# Patient Record
Sex: Male | Born: 1970 | Race: White | Hispanic: No | Marital: Married | Smoking: Never smoker
Health system: Southern US, Community
[De-identification: ages and names within clinical notes are randomized; demographics above are authoritative.]

## PROBLEM LIST (undated history)

## (undated) HISTORY — PX: ELBOW SURGERY: SHX618

## (undated) HISTORY — PX: HERNIA REPAIR: SHX51

## (undated) HISTORY — PX: SHOULDER SURGERY: SHX246

---

## 2009-09-09 ENCOUNTER — Emergency Department (HOSPITAL_BASED_OUTPATIENT_CLINIC_OR_DEPARTMENT_OTHER): Admission: EM | Admit: 2009-09-09 | Discharge: 2009-09-09 | Payer: Self-pay | Admitting: Emergency Medicine

## 2014-06-01 ENCOUNTER — Emergency Department
Admission: EM | Admit: 2014-06-01 | Discharge: 2014-06-01 | Disposition: A | Discharge status: Home / Self Care | Payer: BC Managed Care – PPO | Source: Home / Self Care

## 2014-06-01 ENCOUNTER — Emergency Department (INDEPENDENT_AMBULATORY_CARE_PROVIDER_SITE_OTHER): Payer: BC Managed Care – PPO

## 2014-06-01 ENCOUNTER — Encounter: Payer: Self-pay | Admitting: Emergency Medicine

## 2014-06-01 DIAGNOSIS — M7989 Other specified soft tissue disorders: Secondary | ICD-10-CM

## 2014-06-01 DIAGNOSIS — L02519 Cutaneous abscess of unspecified hand: Secondary | ICD-10-CM

## 2014-06-01 DIAGNOSIS — L03011 Cellulitis of right finger: Secondary | ICD-10-CM

## 2014-06-01 DIAGNOSIS — M79644 Pain in right finger(s): Secondary | ICD-10-CM

## 2014-06-01 DIAGNOSIS — R5383 Other fatigue: Secondary | ICD-10-CM

## 2014-06-01 DIAGNOSIS — L03019 Cellulitis of unspecified finger: Secondary | ICD-10-CM

## 2014-06-01 DIAGNOSIS — M79609 Pain in unspecified limb: Secondary | ICD-10-CM

## 2014-06-01 DIAGNOSIS — R5381 Other malaise: Secondary | ICD-10-CM

## 2014-06-01 LAB — POCT CBC W AUTO DIFF (K'VILLE URGENT CARE)

## 2014-06-01 LAB — TSH: TSH: 4.392 u[IU]/mL (ref 0.350–4.500)

## 2014-06-01 LAB — URIC ACID: Uric Acid, Serum: 6.9 mg/dL (ref 4.0–7.8)

## 2014-06-01 MED ORDER — CEPHALEXIN 500 MG PO CAPS: 500.0000 mg | ORAL_CAPSULE | Freq: Two times a day (BID) | ORAL | Status: AC

## 2014-06-01 MED ORDER — PREDNISONE 20 MG PO TABS: ORAL_TABLET | ORAL | Status: DC

## 2014-06-01 NOTE — ED Provider Notes (Signed)
CSN: [id number]     Arrival date & time 06/01/14  0815 History   None    Chief Complaint  Patient presents with  . 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 He noticed some pain in his right thumb approximately 5 days ago. Swelling started a few days later. Warm to touch. Very painful. No known injury. He has taken 3 doses of keflex. Redness has improved but swelling and pain remains the same. Rates pain 4/10 constant and sharp. Worse with any movement or touch. No fever, chills, nausea or vomiting. No personal hx of gout but father does have gout. No NSAIDs taken.   Pt does complain of fatigue and request thyroid checked.   History reviewed. No pertinent past medical history. Past Surgical History  Procedure Laterality Date  . Hernia repair    . Elbow surgery    . Shoulder surgery     History reviewed. No pertinent family history. History  Substance Use Topics  . Smoking status: Never Smoker   . Smokeless tobacco: Not on file  . Alcohol Use: No    Review of Systems  All other systems reviewed and are negative.   Allergies  Review of patient's allergies indicates no known allergies.  Home Medications   Prior to Admission medications   Medication Sig Start Date End Date Taking? Authorizing Provider  cephALEXin (KEFLEX) 500 MG capsule Take 1 capsule (500 mg total) by mouth 2 (two) times daily. 06/01/14   Mahlia Fernando L Keyondre Hepburn, PA-C  predniSONE (DELTASONE) 20 MG tablet Take 3 tablets for 3 days, take 2 tablets for 3 days, take 1 tablet for 3 days, take 1/2 tablet for 3 days. 06/01/14   Conor Filsaime L Griffin Dewilde, PA-C   BP 134/87  Pulse 99  Temp(Src) 97.6 F (36.4 C) (Oral)  Resp 18  Ht  (1.778 m)  Wt 230 lb (104.327 kg)  BMI 33.00 kg/m2  SpO2 97% Physical Exam  Musculoskeletal:  Significant swelling over thenar eminence on right hand. Pain to palpation over base of right  thumb.  Limited ROM due to pain.  Pain with resistance of thumb.   Psychiatric: He has a normal mood and affect.    ED Course  Procedures (including critical care time) Labs Review Labs Reviewed  URIC ACID  TSH  POCT CBC W AUTO DIFF ([facility])    Imaging Review Dg Finger Thumb Right  06/01/2014   CLINICAL DATA:  Pain and swelling  EXAM: RIGHT THUMB 2+V  COMPARISON:  None.  FINDINGS: Frontal, oblique, and lateral views were obtained. There is no fracture or dislocation. Joint spaces appear intact. No erosive change. No radiopaque foreign body.  IMPRESSION: No abnormality noted.   Electronically Signed   By: Bretta Bang M.D.   On: 06/01/2014 09:06     MDM   1. Cellulitis of finger of right hand   2. Swelling of thumb, right   3. Pain of right thumb   4. Other malaise and fatigue    CBC- WBC 10.4, LY 25.2%,  PLT 268, HgB 15.7 White count upper limit normal. Will check uric acid. Will check TSH per pt's request and with dx of fatigue.  Xray normal.  Gout vs sprain vs cellulitis Ice thumb regularly. up to 3 times a day.  Ibuprofen  up to three times a day.  ACE wrap to compress.  Keflex for 7 days.  Prednisone taper.  Follow up with Dr. Karie Schwalbe in 3 days or if symptoms not improving.     Jomarie Longs, PA-C 06/01/14 [phone]

## 2014-06-01 NOTE — Discharge Instructions (Signed)
Ice thumb regularly. up to 3 times a day.  Ibuprofen  up to three times a day.  ACE wrap to compress.  Keflex for 7 days.  Prednisone taper.

## 2014-06-01 NOTE — ED Notes (Signed)
Pt c/o RT hand pain and swelling x 5 days. Denies injury. He reports taking Keflex last night and today.

## 2014-06-07 ENCOUNTER — Telehealth: Payer: Self-pay | Admitting: *Deleted

## 2014-06-08 NOTE — ED Provider Notes (Signed)
Agree with exam, assessment, and plan.   Lattie Haw, MD 06/08/14 [phone]

## 2020-05-22 ENCOUNTER — Emergency Department (HOSPITAL_BASED_OUTPATIENT_CLINIC_OR_DEPARTMENT_OTHER): Payer: 59

## 2020-05-22 ENCOUNTER — Other Ambulatory Visit: Payer: Self-pay

## 2020-05-22 ENCOUNTER — Encounter (HOSPITAL_BASED_OUTPATIENT_CLINIC_OR_DEPARTMENT_OTHER): Payer: Self-pay | Admitting: Emergency Medicine

## 2020-05-22 ENCOUNTER — Emergency Department (HOSPITAL_BASED_OUTPATIENT_CLINIC_OR_DEPARTMENT_OTHER)
Admission: EM | Admit: 2020-05-22 | Discharge: 2020-05-22 | Disposition: A | Discharge status: Home / Self Care | Payer: 59 | Attending: Emergency Medicine | Admitting: Emergency Medicine

## 2020-05-22 DIAGNOSIS — U071 COVID-19: Secondary | ICD-10-CM | POA: Insufficient documentation

## 2020-05-22 DIAGNOSIS — R599 Enlarged lymph nodes, unspecified: Secondary | ICD-10-CM | POA: Diagnosis not present

## 2020-05-22 DIAGNOSIS — R5381 Other malaise: Secondary | ICD-10-CM | POA: Diagnosis present

## 2020-05-22 DIAGNOSIS — J208 Acute bronchitis due to other specified organisms: Secondary | ICD-10-CM

## 2020-05-22 LAB — TROPONIN I (HIGH SENSITIVITY): Troponin I (High Sensitivity): 4 ng/L (ref <18)

## 2020-05-22 LAB — CBC
HCT: 43.5 % (ref 39.0–52.0)
Hemoglobin: 14.3 g/dL (ref 13.0–17.0)
MCH: 28.3 pg (ref 26.0–34.0)
MCHC: 32.9 g/dL (ref 30.0–36.0)
MCV: 86.1 fL (ref 80.0–100.0)
Platelets: 264 10*3/uL (ref 150–400)
RBC: 5.05 MIL/uL (ref 4.22–5.81)
RDW: 14.9 % (ref 11.5–15.5)
WBC: 11.8 10*3/uL — ABNORMAL HIGH (ref 4.0–10.5)
nRBC: 0 % (ref 0.0–0.2)

## 2020-05-22 LAB — BASIC METABOLIC PANEL
Anion gap: 12 (ref 5–15)
BUN: 15 mg/dL (ref 6–20)
CO2: 25 mmol/L (ref 22–32)
Calcium: 8.7 mg/dL — ABNORMAL LOW (ref 8.9–10.3)
Chloride: 99 mmol/L (ref 98–111)
Creatinine, Ser: 0.93 mg/dL (ref 0.61–1.24)
GFR calc Af Amer: >60 mL/min (ref >60)
GFR calc non Af Amer: >60 mL/min (ref >60)
Glucose, Bld: 110 mg/dL — ABNORMAL HIGH (ref 70–99)
Potassium: 4 mmol/L (ref 3.5–5.1)
Sodium: 136 mmol/L (ref 135–145)

## 2020-05-22 LAB — HEPATIC FUNCTION PANEL
ALT: 32 U/L (ref 0–44)
AST: 31 U/L (ref 15–41)
Albumin: 3.5 g/dL (ref 3.5–5.0)
Alkaline Phosphatase: 48 U/L (ref 38–126)
Bilirubin, Direct: 0.2 mg/dL (ref 0.0–0.2)
Indirect Bilirubin: 0.5 mg/dL (ref 0.3–0.9)
Total Bilirubin: 0.7 mg/dL (ref 0.3–1.2)
Total Protein: 7.2 g/dL (ref 6.5–8.1)

## 2020-05-22 MED ORDER — PREDNISONE 10 MG (21) PO TBPK: ORAL_TABLET | Freq: Every day | ORAL | 0 refills | Status: AC

## 2020-05-22 MED ORDER — HYDROCODONE-HOMATROPINE 5-1.5 MG/5ML PO SYRP
5.0000 mL | ORAL_SOLUTION | Freq: Once | ORAL | Status: DC
  Filled 2020-05-22: qty 5

## 2020-05-22 MED ORDER — IOHEXOL 350 MG/ML SOLN
100.0000 mL | Freq: Once | INTRAVENOUS | Status: AC | PRN
  Administered 2020-05-22: 66 mL via INTRAVENOUS

## 2020-05-22 MED ORDER — ALBUTEROL SULFATE HFA 108 (90 BASE) MCG/ACT IN AERS: 8.0000 | INHALATION_SPRAY | Freq: Four times a day (QID) | RESPIRATORY_TRACT | 0 refills | Status: AC | PRN

## 2020-05-22 MED ORDER — AEROCHAMBER PLUS FLO-VU MEDIUM MISC
1.0000 | Freq: Once | Status: AC
  Administered 2020-05-22: 1
  Filled 2020-05-22: qty 1

## 2020-05-22 MED ORDER — HYDROCODONE-HOMATROPINE 5-1.5 MG/5ML PO SYRP: 5.0000 mL | ORAL_SOLUTION | Freq: Four times a day (QID) | ORAL | 0 refills | Status: DC | PRN

## 2020-05-22 MED ORDER — ALBUTEROL SULFATE HFA 108 (90 BASE) MCG/ACT IN AERS
8.0000 | INHALATION_SPRAY | Freq: Once | RESPIRATORY_TRACT | Status: AC
  Administered 2020-05-22: 8 via RESPIRATORY_TRACT
  Filled 2020-05-22: qty 6.7

## 2020-05-22 MED ORDER — HYDROCOD POLST-CPM POLST ER 10-8 MG/5ML PO SUER
5.0000 mL | Freq: Once | ORAL | Status: AC
  Administered 2020-05-22: 5 mL via ORAL
  Filled 2020-05-22: qty 5

## 2020-05-22 MED ORDER — HYDROCODONE-HOMATROPINE 5-1.5 MG/5ML PO SYRP: 5.0000 mL | ORAL_SOLUTION | Freq: Four times a day (QID) | ORAL | 0 refills | Status: AC | PRN

## 2020-05-22 MED ORDER — ALBUTEROL SULFATE (2.5 MG/3ML) 0.083% IN NEBU: 5.0000 mg | INHALATION_SOLUTION | Freq: Once | RESPIRATORY_TRACT | Status: DC

## 2020-05-22 MED ORDER — PREDNISONE 50 MG PO TABS
60.0000 mg | ORAL_TABLET | Freq: Once | ORAL | Status: AC
  Administered 2020-05-22: 60 mg via ORAL
  Filled 2020-05-22: qty 1

## 2020-05-22 NOTE — ED Triage Notes (Signed)
Pt states positive covid test 10 days, increasing coughing and SOB since test, last in past 2 days, back pain from "muscles pulled"

## 2020-05-22 NOTE — ED Notes (Signed)
Pt ambulated from lobby to room, maintained 94-96% O2 but reports difficulty breathing. No change in sats once settled on stretcher.

## 2020-05-22 NOTE — ED Notes (Signed)
Sent blue and dark green also to lab, extra tubes drawn

## 2020-05-22 NOTE — Discharge Instructions (Signed)
You were seen in the ED for cough with pain, shortness of breath  Symptoms likely from continued COVID illness  Other testing done today was normal. There was no blood clot on CT.   Take 8 puffs of albuterol with spacer every 4 hours.  Prednisone will help with airway inflammation.  Hycodan syrup every 6 hours to suppress cough and help with pain related to cough  Schedule follow up appointment with primary care doctor in 72 hours for re-evaluation to ensure symptoms are improving   Stay well-hydrated. Rest. You can use over the counter medications to help with symptoms: [phone] mg acetaminophen (tylenol) every 6 hours, around the clock to help with associated fevers, sore throat, headaches, generalized body aches and malaise.  Oxymetazoline (afrin) intranasal spray once daily for no more than 3 days to help with congestion, after 3 days you can switch to another over-the-counter nasal steroid spray such as fluticasone (flonase) Allergy medication (loratadine, cetirizine, etc) and phenylephrine (sudafed) help with nasal congestion, runny nose and postnasal drip.   Wash your hands often to prevent spread.  Stay hydrated with plenty of clear fluids Rest   Return to the ED if symptoms are worsening or severe, there is increased work of breathing, chest pain or shortness of breath with exertion or activity, inability to tolerate fluids due to persistent vomiting despite nausea medicines, passing out, light headedness.

## 2020-05-22 NOTE — ED Provider Notes (Signed)
[facility] EMERGENCY DEPARTMENT Provider Note   CSN: [id number] Arrival date & time: 05/22/20  1245     History Chief Complaint  Patient presents with  . Shortness of Breath    Benjamin Hester is a 49 y.o. male with pertinent history of Cov[facility] diagnosis [facility] ago [facility] presents to the ED for evaluation of worsening symptoms for the last 2 days. "I can't put up with it anymore".  Reports initially just having generalized malaise but over the last 2 days has had worsening dry forceful coughing fits, pain in his upper back with coughing, shortness of breath.  Overall feels poorly.  No energy. Today he fell down to the floor from the severe pain he had during a coughing fit and felt like he was almost going to pass out.  Denies frank loss of consciousness.  Denies chest pain but states the back of his lungs hurt when he coughs, moves.  He was seen by a general doctor a week ago and prescribed amoxicillin and inhalers.  States he cannot take a deep enough breath for the inhalers to work.  States amoxicillin helps with the mucus in his lungs but the cough persists.  Has taken Motrin as needed as well.  Denies any other symptoms including fever, congestion, sore throat, hemoptysis, vomiting, diarrhea, abdominal pain, leg swelling, loss of taste or smell.  Denies significant medical history.  No history of blood clots, recent surgery, prolonged immobilization, hormone therapy. No tobacco use.   HPI     History reviewed. No pertinent past medical history.  There are no problems to display for this patient.   Past Surgical History:  Procedure Laterality Date  . ELBOW SURGERY    . HERNIA REPAIR    . SHOULDER SURGERY         History reviewed. No pertinent family history.  Social History   Tobacco Use  . Smoking status: Never Smoker  Substance Use Topics  . Alcohol use: No  . Drug use: No    Home Medications Prior to Admission medications   Medication Sig  Start Date End Date Taking? Authorizing Provider  cephALEXin (KEFLEX) 500 MG capsule Take 1 capsule (500 mg total) by mouth 2 (two) times daily. 06/01/14   Breeback, Jade L, PA-C  predniSONE (DELTASONE) 20 MG tablet Take 3 tablets for 3 days, take 2 tablets for 3 days, take 1 tablet for 3 days, take 1/2 tablet for 3 days. 06/01/14   Jomarie Longs, PA-C    Allergies    Patient has no known allergies.  Review of Systems   Review of Systems  Respiratory: Positive for cough and shortness of breath.   Musculoskeletal: Positive for back pain and myalgias.  Neurological: Positive for weakness (generalized) and light-headedness.  All other systems reviewed and are negative.   Physical Exam Updated Vital Signs BP (!) 154/81 (BP Location: Right Arm)   Pulse (!) 104   Temp 98.1 F (36.7 C) (Oral)   Resp (!) 23   Ht 5\' 10"  (1.778 m)   Wt 104.3 kg   SpO2 94%   BMI 33.00 kg/m   Physical Exam Vitals and nursing note reviewed.  Constitutional:      General: He is not in acute distress.    Appearance: He is well-developed.     Comments: Appears tired but nontoxic  HENT:     Head: Normocephalic and atraumatic.     Right Ear: External ear normal.  Left Ear: External ear normal.     Nose: Nose normal.  Eyes:     General: No scleral icterus.    Conjunctiva/sclera: Conjunctivae normal.  Cardiovascular:     Rate and Rhythm: Normal rate and regular rhythm.     Heart sounds: Normal heart sounds. No murmur heard.      Comments: No lower extremity edema.  No calf tenderness.  No murmurs Pulmonary:     Effort: Pulmonary effort is normal.     Breath sounds: Decreased breath sounds present.     Comments: Lungs clear throughout however diminished sounds in the mid/lower lobes.  Reproducible posterior thoracic/lateral chest wall tenderness.  No crackles.  No rhonchi.  No wheezing. Musculoskeletal:        General: No deformity. Normal range of motion.     Cervical back: Normal range of  motion and neck supple.  Skin:    General: Skin is warm and dry.     Capillary Refill: Capillary refill takes less than 2 seconds.  Neurological:     Mental Status: He is alert and oriented to person, place, and time.  Psychiatric:        Behavior: Behavior normal.        Thought Content: Thought content normal.        Judgment: Judgment normal.     ED Results / Procedures / Treatments   Labs (all labs ordered are listed, but only abnormal results are displayed) Labs Reviewed  BASIC METABOLIC PANEL - Abnormal; Notable for the following components:      Result Value   Glucose, Bld 110 (*)    Calcium 8.7 (*)    All other components within normal limits  CBC - Abnormal; Notable for the following components:   WBC 11.8 (*)    All other components within normal limits  HEPATIC FUNCTION PANEL  TROPONIN I (HIGH SENSITIVITY)  TROPONIN I (HIGH SENSITIVITY)    EKG None  Radiology DG Chest 2 View  Result Date: 05/22/2020 CLINICAL DATA:  Shortness of breath.  COVID-19. EXAM: CHEST - 2 VIEW COMPARISON:  None. FINDINGS: The heart size and mediastinal contours are within normal limits. No pneumothorax or pleural effusion is noted. Bilateral patchy airspace opacities are noted consistent with multifocal pneumonia due to COVID-19. The visualized skeletal structures are unremarkable. IMPRESSION: Bilateral multifocal pneumonia. Electronically Signed   By: Lupita Raider M.D.   On: 05/22/2020 14:00   CT Angio Chest PE W and/or Wo Contrast  Result Date: 05/22/2020 CLINICAL DATA:  Worsening shortness of breath, cough, back pain. COVID diagnosis 10 days ago. EXAM: CT ANGIOGRAPHY CHEST WITH CONTRAST TECHNIQUE: Multidetector CT imaging of the chest was performed using the standard protocol during bolus administration of intravenous contrast. Multiplanar CT image reconstructions and MIPs were obtained to evaluate the vascular anatomy. CONTRAST:  55mL OMNIPAQUE IOHEXOL 350 MG/ML SOLN COMPARISON:  None.  FINDINGS: Cardiovascular: There is no pulmonary embolism identified within the main, lobar or segmental pulmonary arteries bilaterally. No thoracic aortic aneurysm or evidence of aortic dissection. Heart size is within normal limits. No pericardial effusion. Mediastinum/Nodes: Esophagus appears normal. No mass or enlarged lymph nodes are seen within the mediastinum or perihilar regions. Mildly prominent lymph nodes in the perihilar regions are likely reactive in nature. Trachea and central bronchi are unremarkable. Lungs/Pleura: Patchy bilateral ground-glass airspace opacities, lower lobe predominant, peripheral predominance, consistent with multifocal pneumonia. No pleural effusion or pneumothorax. Upper Abdomen: Limited images of the upper abdomen are unremarkable. Musculoskeletal: No acute  or suspicious osseous finding. Review of the MIP images confirms the above findings. IMPRESSION: 1. Patchy bilateral ground-glass airspace opacities, lower lobe predominant, consistent with multifocal pneumonia. Favor COVID-19 pneumonia. 2. No pulmonary embolism seen. 3. Mildly prominent lymph nodes in the perihilar regions, likely reactive in nature. Electronically Signed   By: Bary Richard M.D.   On: 05/22/2020 15:40    Procedures Procedures (including critical care time)  Medications Ordered in ED Medications  albuterol (VENTOLIN HFA) 108 (90 Base) MCG/ACT inhaler 8 puff (8 puffs Inhalation Given 05/22/20 1444)  AeroChamber Plus Flo-Vu Medium MISC 1 each (1 each Other Given 05/22/20 1448)  predniSONE (DELTASONE) tablet 60 mg (60 mg Oral Given 05/22/20 1439)  chlorpheniramine-HYDROcodone (TUSSIONEX) 10-8 MG/5ML suspension 5 mL (5 mLs Oral Given 05/22/20 1440)  iohexol (OMNIPAQUE) 350 MG/ML injection 100 mL (66 mLs Intravenous Contrast Given 05/22/20 1525)    ED Course  I have reviewed the triage vital signs and the nursing notes.  Pertinent labs & imaging results that were available during my care of the patient were  reviewed by me and considered in my medical decision making (see chart for details).  Clinical Course as of May 22 1624  Sun May 22, 2020  1410 Pulse Rate(!): 118 [CG]  1410 Resp(!): 24 [CG]  1410 Temp: 98.7 F (37.1 C) [CG]  1410 WBC(!): 11.8 [CG]  1411 The heart size and mediastinal contours are within normal limits. No pneumothorax or pleural effusion is noted. Bilateral patchy airspace opacities are noted consistent with multifocal pneumonia due to COVID-19. The visualized skeletal structures are unremarkable.  DG Chest 2 View [CG]  1545 IMPRESSION: 1. Patchy bilateral ground-glass airspace opacities, lower lobe predominant, consistent with multifocal pneumonia. Favor COVID-19 pneumonia. 2. No pulmonary embolism seen. 3. Mildly prominent lymph nodes in the perihilar regions, likely reactive in nature.  CT Angio Chest PE W and/or Wo Contrast [CG]    Clinical Course User Index [CG] Jerrell Mylar   MDM Rules/Calculators/A&P                          49 year old male presents for worsening Covid symptoms specifically worsening forceful coughing fits, shortness of breath, generalized malaise for the last 2 days.  Has been on amoxicillin and inhalers for 1 week.  Denies significant cardio or pulmonary medical problems, tobacco use.  No street blood clots.  Chief complain involves an extensive number of treatment options and is a complaint that carries with it a high risk of complications and morbidity and mortality.    Differential diagnosis includes worsening Covid illness, pulmonary embolism.  He denies frank chest pain, myocarditis, pericarditis less likely.  Laboratory studies and imaging ordered in triage RN and I ordered additional laboratory and imaging studies including CBC, BMP, chest x-ray and EKG.  Given symptoms and 10 days post Covid diagnosis, I have ordered a CT to rule out a PE.  I have personally visualized and interpreted labs and imaging.    ER work up  reveals minimal leukocytosis to BC 11.8.  Hyperglycemia, mild.  LFTs normal.  Troponin undetectable.  EKG with sinus tachycardia but no signs of right ventricular strain, ischemia.  Chest x-ray consistent with multifocal atypical pneumonia.  CTA is negative for PE and shows bilateral lower lobe infiltrates.  I ordered medications including Tussionex, albuterol inhaler/spacer, prednisone.  I have ordered continuous cardiac monitoring, pulse ox.  Will plan for serial re-examinations. Close monitoring.   1618: I have  re-evaluated the patient.   He reports mild improvement in symptoms.  Tachycardia has improved.  Mildly tachypneic RR 23 but SPO2 94%.  Ambulated here without hypoxia.  Discussed ER results reassuring.    Given benign work-up, improvement in clinical symptoms and normal SPO2, we will discharge with prednisone taper, Hycodan, albuterol inhaler for possible bronchitis/bronchospasm.  Encouraged PCP follow-up in 72 hours to reassess for clinical deterioration.  Return precautions discussed.  He is comfortable with this plan.  Final Clinical Impression(s) / ED Diagnoses Final diagnoses:  Acute bronchitis due to COVID-19 virus    Rx / DC Orders ED Discharge Orders    None       Liberty HandyGibbons, Birtie Fellman J, PA-C 05/22/20 1625    Alvira MondaySchlossman, Erin, MD 05/23/20 1038

## 2021-09-12 IMAGING — CT CT ANGIO CHEST
2 of 10 series · 17 of 36 positions shown · IV contrast (Omnipaque)
Comparison: None.

CLINICAL DATA: Worsening shortness of breath, cough, back pain.
COVID diagnosis 10 days ago.

EXAM:
CT ANGIOGRAPHY CHEST WITH CONTRAST
TECHNIQUE: Multidetector CT imaging of the chest was performed using the
standard protocol during bolus administration of intravenous
contrast. Multiplanar CT image reconstructions and MIPs were
obtained to evaluate the vascular anatomy.
CONTRAST:  66mL OMNIPAQUE IOHEXOL 350 MG/ML SOLN

[Series 6: pe thins · axial · 0.90mm/px · z∈[+733,+1013]mm · 16 of 423 slices shown]
[im 25/423  lung]
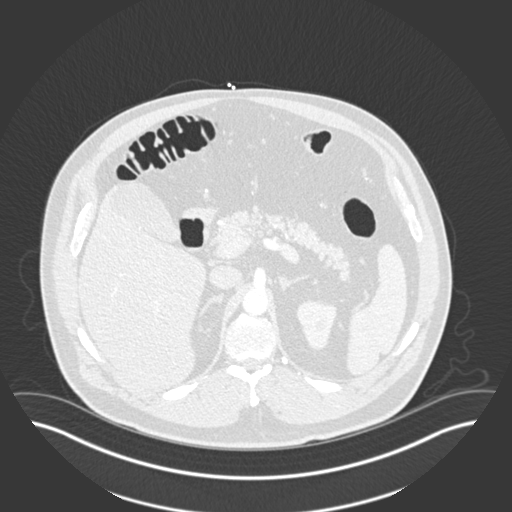
[im 50/423  mediastinal]
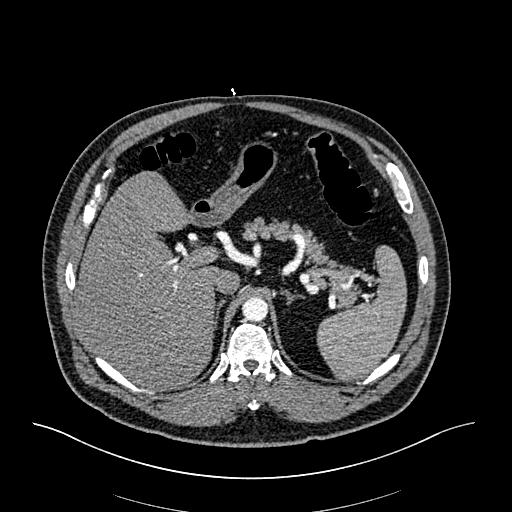
[im 75/423  lung]
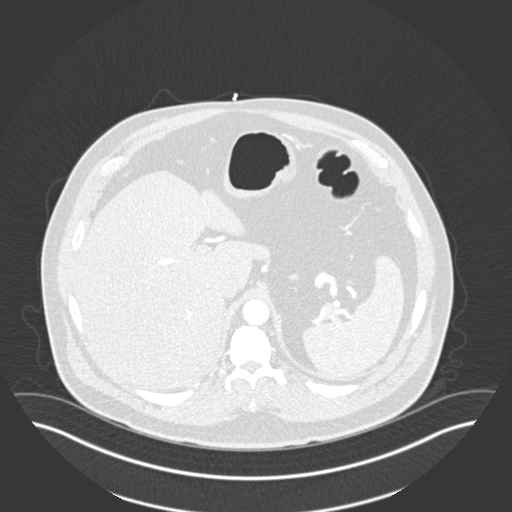
[im 100/423  mediastinal]
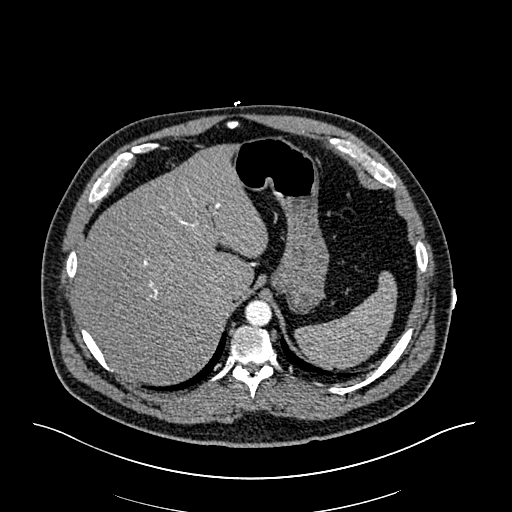
[im 125/423  lung]
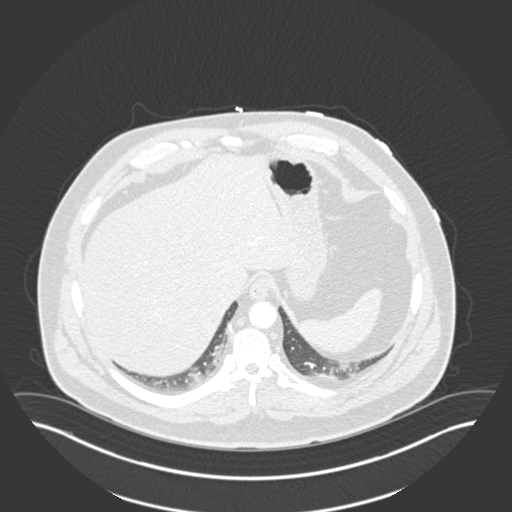
[im 149/423  mediastinal]
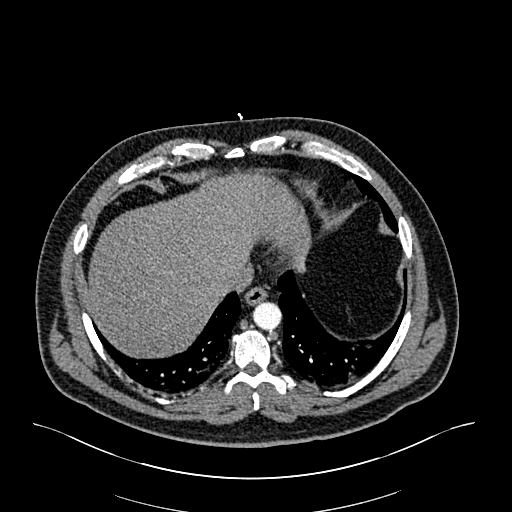
[im 174/423  lung]
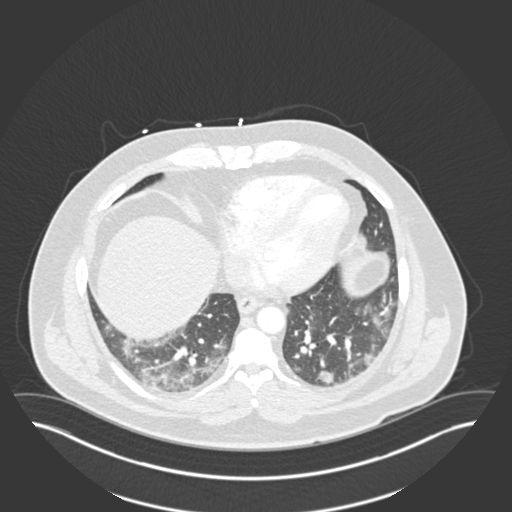
[im 199/423  mediastinal]
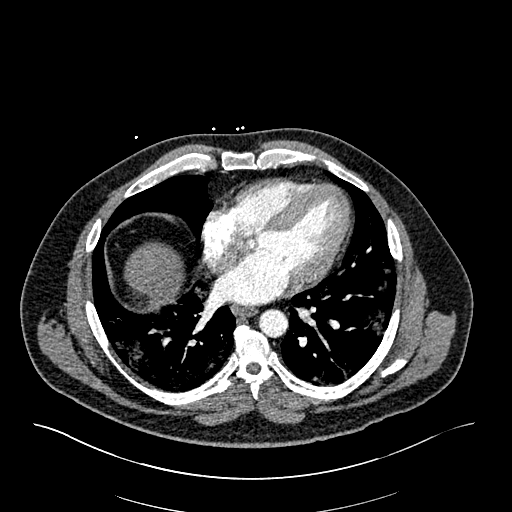
[im 224/423  lung]
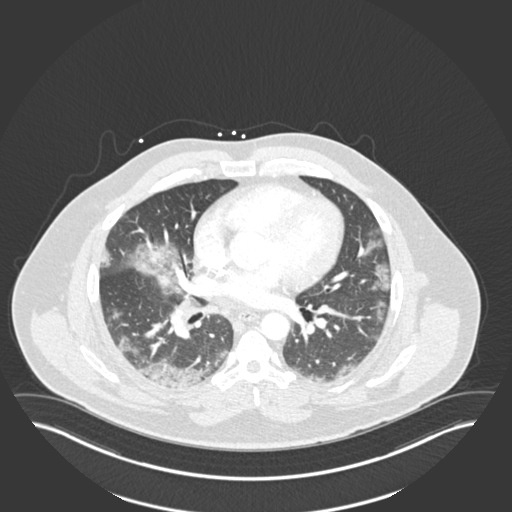
[im 249/423  mediastinal]
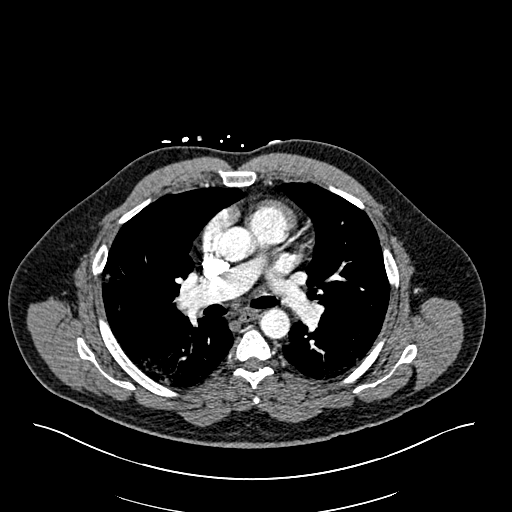
[im 274/423  lung]
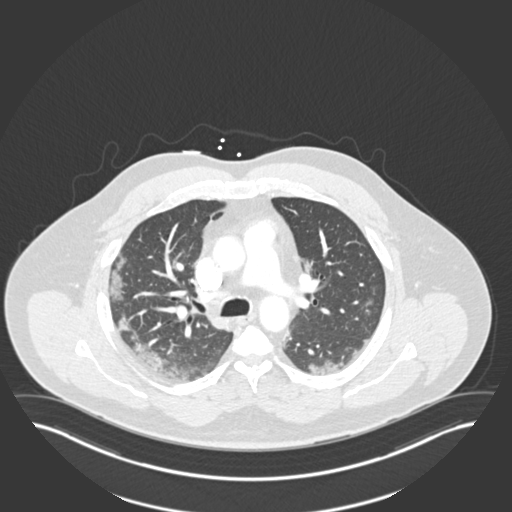
[im 298/423  mediastinal]
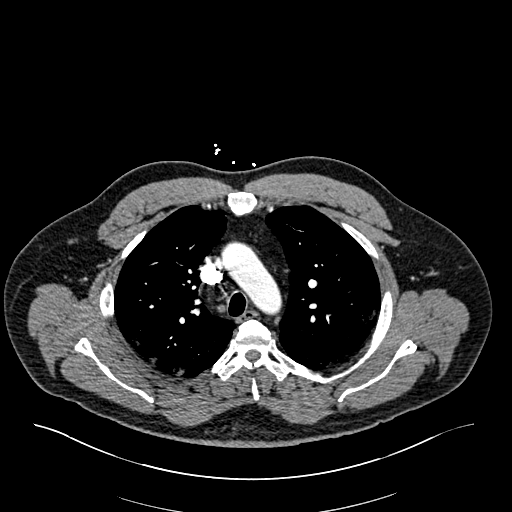
[im 323/423  lung]
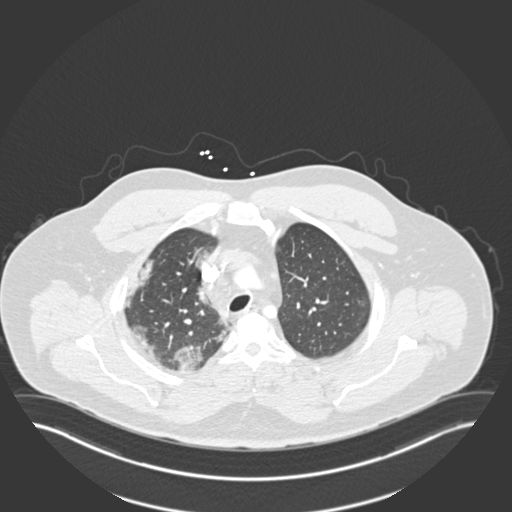
[im 348/423  mediastinal]
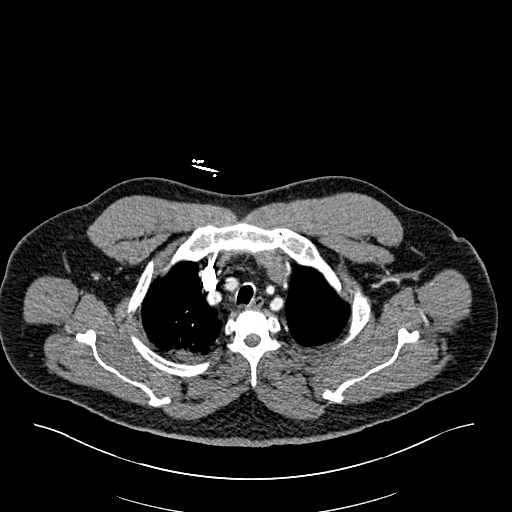
[im 373/423  lung]
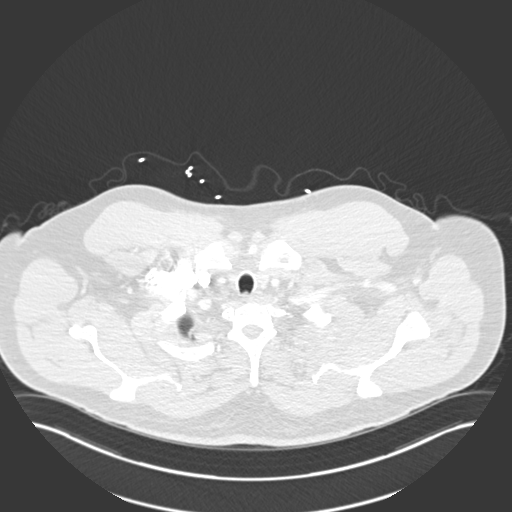
[im 398/423  mediastinal]
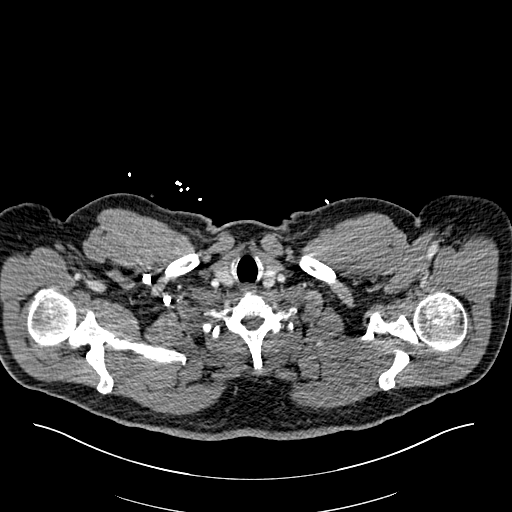

[Series 8: pe coronal mpr · coronal · 0.62mm/px · 1 of 108 slices shown]
[im 54/108  mediastinal]
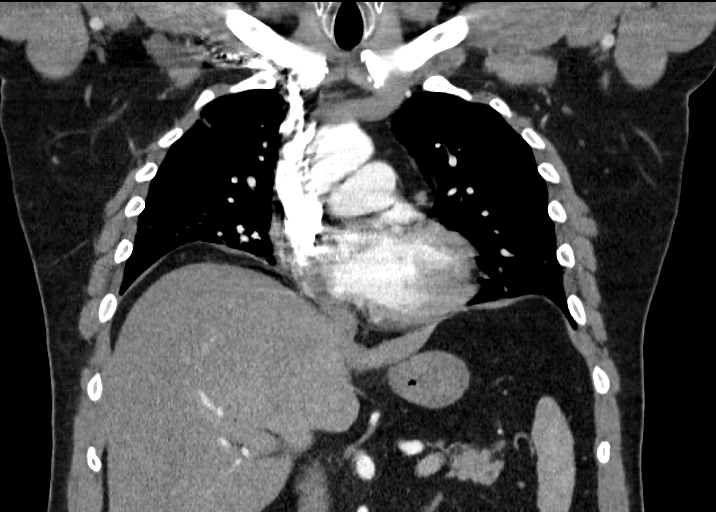

[17 of 36 positions shown; findings below may reference images not displayed]

FINDINGS: Cardiovascular: There is no pulmonary embolism identified within the
main, lobar or segmental pulmonary arteries bilaterally.

No thoracic aortic aneurysm or evidence of aortic dissection. Heart
size is within normal limits. No pericardial effusion.

Mediastinum/Nodes: Esophagus appears normal. No mass or enlarged
lymph nodes are seen within the mediastinum or perihilar regions.
Mildly prominent lymph nodes in the perihilar regions are likely
reactive in nature. Trachea and central bronchi are unremarkable.

Lungs/Pleura: Patchy bilateral ground-glass airspace opacities,
lower lobe predominant, peripheral predominance, consistent with
multifocal pneumonia. No pleural effusion or pneumothorax.

Upper Abdomen: Limited images of the upper abdomen are unremarkable.

Musculoskeletal: No acute or suspicious osseous finding.

Review of the MIP images confirms the above findings.
IMPRESSION: 1. Patchy bilateral ground-glass airspace opacities, lower lobe
predominant, consistent with multifocal pneumonia. Favor [id number]
pneumonia.
2. No pulmonary embolism seen.
3. Mildly prominent lymph nodes in the perihilar regions, likely
reactive in nature.

## 2022-11-08 ENCOUNTER — Emergency Department (HOSPITAL_COMMUNITY)
Admission: EM | Admit: 2022-11-08 | Discharge: 2022-11-08 | Disposition: A | Discharge status: Home / Self Care | Payer: PRIVATE HEALTH INSURANCE | Attending: Emergency Medicine | Admitting: Emergency Medicine

## 2022-11-08 ENCOUNTER — Other Ambulatory Visit: Payer: Self-pay

## 2022-11-08 ENCOUNTER — Emergency Department (HOSPITAL_COMMUNITY): Payer: Self-pay

## 2022-11-08 ENCOUNTER — Encounter (HOSPITAL_COMMUNITY): Payer: Self-pay

## 2022-11-08 DIAGNOSIS — K5732 Diverticulitis of large intestine without perforation or abscess without bleeding: Secondary | ICD-10-CM | POA: Insufficient documentation

## 2022-11-08 DIAGNOSIS — K5792 Diverticulitis of intestine, part unspecified, without perforation or abscess without bleeding: Secondary | ICD-10-CM

## 2022-11-08 DIAGNOSIS — R1030 Lower abdominal pain, unspecified: Secondary | ICD-10-CM | POA: Diagnosis present

## 2022-11-08 LAB — COMPREHENSIVE METABOLIC PANEL
ALT: 28 U/L (ref 0–44)
AST: 25 U/L (ref 15–41)
Albumin: 4 g/dL (ref 3.5–5.0)
Alkaline Phosphatase: 45 U/L (ref 38–126)
Anion gap: 9 (ref 5–15)
BUN: 20 mg/dL (ref 6–20)
CO2: 23 mmol/L (ref 22–32)
Calcium: 8.6 mg/dL — ABNORMAL LOW (ref 8.9–10.3)
Chloride: 101 mmol/L (ref 98–111)
Creatinine, Ser: 1.16 mg/dL (ref 0.61–1.24)
GFR, Estimated: >60 mL/min (ref >60)
Glucose, Bld: 119 mg/dL — ABNORMAL HIGH (ref 70–99)
Potassium: 4.2 mmol/L (ref 3.5–5.1)
Sodium: 133 mmol/L — ABNORMAL LOW (ref 135–145)
Total Bilirubin: 0.7 mg/dL (ref 0.3–1.2)
Total Protein: 6.7 g/dL (ref 6.5–8.1)

## 2022-11-08 LAB — CBC WITH DIFFERENTIAL/PLATELET
Abs Immature Granulocytes: 0.07 10*3/uL (ref 0.00–0.07)
Basophils Absolute: 0.1 10*3/uL (ref 0.0–0.1)
Basophils Relative: 0 %
Eosinophils Absolute: 0.2 10*3/uL (ref 0.0–0.5)
Eosinophils Relative: 1 %
HCT: 43.9 % (ref 39.0–52.0)
Hemoglobin: 14.2 g/dL (ref 13.0–17.0)
Immature Granulocytes: 0 %
Lymphocytes Relative: 9 %
Lymphs Abs: 1.5 10*3/uL (ref 0.7–4.0)
MCH: 28.6 pg (ref 26.0–34.0)
MCHC: 32.3 g/dL (ref 30.0–36.0)
MCV: 88.5 fL (ref 80.0–100.0)
Monocytes Absolute: 1.4 10*3/uL — ABNORMAL HIGH (ref 0.1–1.0)
Monocytes Relative: 8 %
Neutro Abs: 13.5 10*3/uL — ABNORMAL HIGH (ref 1.7–7.7)
Neutrophils Relative %: 82 %
Platelets: 244 10*3/uL (ref 150–400)
RBC: 4.96 MIL/uL (ref 4.22–5.81)
RDW: 14.7 % (ref 11.5–15.5)
WBC: 16.7 10*3/uL — ABNORMAL HIGH (ref 4.0–10.5)
nRBC: 0 % (ref 0.0–0.2)

## 2022-11-08 LAB — URINALYSIS, ROUTINE W REFLEX MICROSCOPIC
Bacteria, UA: NONE SEEN
Bilirubin Urine: NEGATIVE
Glucose, UA: NEGATIVE mg/dL
Hgb urine dipstick: NEGATIVE
Ketones, ur: NEGATIVE mg/dL
Leukocytes,Ua: NEGATIVE
Nitrite: NEGATIVE
Protein, ur: NEGATIVE mg/dL
Specific Gravity, Urine: 1.014 (ref 1.005–1.030)
pH: 6 (ref 5.0–8.0)

## 2022-11-08 LAB — TYPE AND SCREEN
ABO/RH(D): A POS
Antibody Screen: NEGATIVE

## 2022-11-08 LAB — LIPASE, BLOOD: Lipase: 43 U/L (ref 11–51)

## 2022-11-08 LAB — ABO/RH: ABO/RH(D): A POS

## 2022-11-08 MED ORDER — SODIUM CHLORIDE (PF) 0.9 % IJ SOLN
INTRAMUSCULAR | Status: AC
  Filled 2022-11-08: qty 50

## 2022-11-08 MED ORDER — SODIUM CHLORIDE 0.9 % IV BOLUS
1000.0000 mL | Freq: Once | INTRAVENOUS | Status: AC
  Administered 2022-11-08: 1000 mL via INTRAVENOUS

## 2022-11-08 MED ORDER — MORPHINE SULFATE (PF) 4 MG/ML IV SOLN: 4.0000 mg | Freq: Once | INTRAVENOUS | Status: DC

## 2022-11-08 MED ORDER — IOHEXOL 300 MG/ML  SOLN
100.0000 mL | Freq: Once | INTRAMUSCULAR | Status: AC | PRN
  Administered 2022-11-08: 100 mL via INTRAVENOUS

## 2022-11-08 MED ORDER — AMOXICILLIN-POT CLAVULANATE 875-125 MG PO TABS
1.0000 | ORAL_TABLET | Freq: Once | ORAL | Status: AC
  Administered 2022-11-08: 1 via ORAL
  Filled 2022-11-08: qty 1

## 2022-11-08 MED ORDER — AMOXICILLIN-POT CLAVULANATE 875-125 MG PO TABS: 1.0000 | ORAL_TABLET | Freq: Two times a day (BID) | ORAL | 0 refills | Status: AC

## 2022-11-08 MED ORDER — KETOROLAC TROMETHAMINE 15 MG/ML IJ SOLN
15.0000 mg | Freq: Once | INTRAMUSCULAR | Status: AC
  Administered 2022-11-08: 15 mg via INTRAVENOUS
  Filled 2022-11-08: qty 1

## 2022-11-08 NOTE — ED Provider Notes (Signed)
[place] AT [facility] Provider Note   CSN: [id number] Arrival date & time: 11/08/22  [id number]     History  Chief Complaint  Patient presents with   Abdominal Pain    Benjamin Hester is a 52 y.o. male.  With history of hernia repair who presents to the ED for evaluation of lower abdominal pain.  Symptoms began last night and have progressively gotten worse into this morning.  Pain is described as a constant dull ache with intermittent sharp and shooting pains.  He states the pain has improved with urination.  Also improved with 2 small bowel movements.  He denies nausea or vomiting.  Denies history of similar.  Also reports subjective fevers but denies chills.  Symptoms are not worsened by movement.   Abdominal Pain      Home Medications Prior to Admission medications   Medication Sig Start Date End Date Taking? Authorizing Provider  amoxicillin-clavulanate (AUGMENTIN) 875-125 MG tablet Take 1 tablet by mouth every 12 (twelve) hours. 11/08/22  Yes Saryn Cherry, [place] Folk, PA-C  albuterol (VENTOLIN HFA) 108 (90 Base) MCG/ACT inhaler Inhale 8 puffs into the lungs every 6 (six) hours as needed for wheezing or shortness of breath. 05/22/20   Kinnie Feil, PA-C  cephALEXin (KEFLEX) 500 MG capsule Take 1 capsule (500 mg total) by mouth 2 (two) times daily. 06/01/14   Breeback, Jade L, PA-C  HYDROcodone-homatropine (HYCODAN) 5-1.5 MG/5ML syrup Take 5 mLs by mouth every 6 (six) hours as needed for cough. FOR DISRUPTIVE NIGHT TIME COUGH AND BODY ACHES 05/22/20   Kinnie Feil, PA-C  predniSONE (STERAPRED UNI-PAK 21 TAB) 10 MG (21) TBPK tablet Take by mouth daily. 6 tabs daily  for 2 days, 5 tabs for 2 days, 4 tabs for 2 days, 3 tabs for 2 days, 2 tabs for 2 days, 1 tab daily for 2 days 05/22/20   Kinnie Feil, PA-C      Allergies    Patient has no known allergies.    Review of Systems   Review of Systems  Gastrointestinal:  Positive for abdominal pain.   All other systems reviewed and are negative.   Physical Exam Updated Vital Signs BP (!) 150/89   Pulse (!) 101   Temp 98.1 F (36.7 C) (Oral)   Resp 20   SpO2 96%  Physical Exam Vitals and nursing note reviewed.  Constitutional:      General: He is not in acute distress.    Appearance: He is well-developed. He is obese. He is not ill-appearing, toxic-appearing or diaphoretic.  HENT:     Head: Normocephalic and atraumatic.  Eyes:     Conjunctiva/sclera: Conjunctivae normal.  Cardiovascular:     Rate and Rhythm: Normal rate and regular rhythm.     Heart sounds: No murmur heard. Pulmonary:     Effort: Pulmonary effort is normal. No respiratory distress.     Breath sounds: Normal breath sounds. No stridor. No wheezing, rhonchi or rales.  Abdominal:     General: Bowel sounds are normal.     Palpations: Abdomen is soft.     Tenderness: There is abdominal tenderness in the right lower quadrant, suprapubic area and left lower quadrant. There is guarding. Negative signs include McBurney's sign, psoas sign and obturator sign.  Musculoskeletal:        General: No swelling.     Cervical back: Neck supple.  Skin:    General: Skin is warm and dry.  Capillary Refill: Capillary refill takes less than 2 seconds.  Neurological:     General: No focal deficit present.     Mental Status: He is alert and oriented to person, place, and time.  Psychiatric:        Mood and Affect: Mood normal.        Behavior: Behavior normal.     ED Results / Procedures / Treatments   Labs (all labs ordered are listed, but only abnormal results are displayed) Labs Reviewed  CBC WITH DIFFERENTIAL/PLATELET - Abnormal; Notable for the following components:      Result Value   WBC 16.7 (*)    Neutro Abs 13.5 (*)    Monocytes Absolute 1.4 (*)    All other components within normal limits  COMPREHENSIVE METABOLIC PANEL - Abnormal; Notable for the following components:   Sodium 133 (*)    Glucose, Bld  119 (*)    Calcium 8.6 (*)    All other components within normal limits  LIPASE, BLOOD  URINALYSIS, ROUTINE W REFLEX MICROSCOPIC  TYPE AND SCREEN  ABO/RH    EKG None  Radiology CT Abdomen Pelvis W Contrast  Result Date: 11/08/2022 CLINICAL DATA:  Right lower quadrant abdominal pain after a bowel movement 11/07/2022. Elevated white blood cell count. EXAM: CT ABDOMEN AND PELVIS WITH CONTRAST TECHNIQUE: Multidetector CT imaging of the abdomen and pelvis was performed using the standard protocol following bolus administration of intravenous contrast. RADIATION DOSE REDUCTION: This exam was performed according to the departmental dose-optimization program which includes automated exposure control, adjustment of the mA and/or kV according to patient size and/or use of iterative reconstruction technique. CONTRAST:  100 mL OMNIPAQUE IOHEXOL 300 MG/ML  SOLN COMPARISON:  None Available. FINDINGS: Lower chest: Lung bases demonstrate mild dependent atelectasis. No pleural or pericardial effusion. Hepatobiliary: No focal liver abnormality is seen. No gallstones, gallbladder wall thickening, or biliary dilatation. Pancreas: Unremarkable. No pancreatic ductal dilatation or surrounding inflammatory changes. Spleen: Normal in size without focal abnormality. Adrenals/Urinary Tract: Adrenal glands are unremarkable. Kidneys are normal, without renal calculi, focal lesion, or hydronephrosis. Bladder is unremarkable. Stomach/Bowel: The patient has diverticulosis most notable along the descending and sigmoid colon. There is wall thickening and stranding about the mid sigmoid colon consistent with acute diverticulitis. No abscess or perforation. The stomach, small bowel and appendix appear normal. Vascular/Lymphatic: No significant vascular findings are present. No enlarged abdominal or pelvic lymph nodes. Reproductive: Prostate is unremarkable. Other: No free intraperitoneal air fluid. Musculoskeletal: No acute or focal  abnormality. IMPRESSION: The exam is positive for sigmoid diverticulitis without abscess or perforation Electronically Signed   By: Inge Rise M.D.   On: 11/08/2022 08:33    Procedures Procedures    Medications Ordered in ED Medications  sodium chloride (PF) 0.9 % injection (has no administration in time range)  sodium chloride 0.9 % bolus 1,000 mL (0 mLs Intravenous Stopped 11/08/22 0906)  iohexol (OMNIPAQUE) 300 MG/ML solution 100 mL (100 mLs Intravenous Contrast Given 11/08/22 0751)  ketorolac (TORADOL) 15 MG/ML injection 15 mg (15 mg Intravenous Given 11/08/22 0853)  amoxicillin-clavulanate (AUGMENTIN) 875-125 MG per tablet 1 tablet (1 tablet Oral Given 11/08/22 [id number])    ED Course/ Medical Decision Making/ A&P                             Medical Decision Making Amount and/or Complexity of Data Reviewed Radiology: ordered.  This patient presents to the ED for concern of  abdominal pain, this involves an extensive number of treatment options, and is a complaint that carries with it a high risk of complications and morbidity.  The differential diagnosis for generalized abdominal pain includes, but is not limited to AAA, gastroenteritis, appendicitis, Bowel obstruction, Bowel perforation. Gastroparesis, DKA, Hernia, Inflammatory bowel disease, mesenteric ischemia, pancreatitis, peritonitis SBP, volvulus.   Co morbidities that complicate the patient evaluation  hernia repair  My initial workup includes labs, fluids, CT abdomen pelvis  Additional history obtained from: Nursing notes from this visit.  I ordered, reviewed and interpreted labs which include: CBC, CMP, lipase, urinalysis.  Leukocytosis of 16.7, mild hyponatremia of 133, hyperglycemia of 119  I ordered imaging studies including CT abdomen pelvis I independently visualized and interpreted imaging which showed diverticulitis I agree with the radiologist interpretation  Cardiac Monitoring:  The patient was maintained  on a cardiac monitor.  I personally viewed and interpreted the cardiac monitored which showed an underlying rhythm of: borderline sinus tachycardia  Afebrile, borderline tachycardic prior to pain medication.  52 year old male presents to the ED for evaluation of lower abdominal pain with sudden onset.  Exam was remarkable for lower abdominal tenderness to palpation with no peritoneal signs.  Lab workup significant for leukocytosis with neutrophilia.  CT positive for diverticulitis.  She decision-making conversation was had with patient regarding antibiotics.  Will proceed with Augmentin.  First dose was given in the ED. Passed his p.o. challenge.  He is scheduled for colonoscopy in May.  He was encouraged to follow-up with his primary care provider in 7 days if possible for reevaluation.  He was encouraged to eat a low residue diet and drink plenty of fluids.  He was given strict return precautions.  Stable at discharge.  At this time there does not appear to be any evidence of an acute emergency medical condition and the patient appears stable for discharge with appropriate outpatient follow up. Diagnosis was discussed with patient who verbalizes understanding of care plan and is agreeable to discharge. I have discussed return precautions with patient and wife who verbalizes understanding. Patient encouraged to follow-up with their PCP within 1 week. All questions answered.  Patient's case discussed with Dr. Mayra Neer who agrees with plan to discharge with follow-up.   Note: Portions of this report may have been transcribed using voice recognition software. Every effort was made to ensure accuracy; however, inadvertent computerized transcription errors may still be present.        Final Clinical Impression(s) / ED Diagnoses Final diagnoses:  Diverticulitis    Rx / DC Orders ED Discharge Orders          Ordered    amoxicillin-clavulanate (AUGMENTIN) 875-125 MG tablet  Every 12 hours         11/08/22 0916              Roylene Reason, PA-C 11/08/22 0920    Audley Hose, MD 11/08/22 1130

## 2022-11-08 NOTE — ED Notes (Signed)
Pt provided with urinal and informed of need for urine sample when he is able.

## 2022-11-08 NOTE — ED Triage Notes (Signed)
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 After Pt urinated, the abdominal pain subsided. The bowel movement, he reports bleeding. 9/10 pain, sharp stabbing. Pt has been hurting since yesterday and his captain made him to come to the ER.

## 2022-11-08 NOTE — Discharge Instructions (Signed)
You have been seen today for your complaint of abdominal pain. Your lab work showed a high white blood cell count. Your imaging showed diverticulitis. Your discharge medications include Augmentin. This is an antibiotic. You should take it as prescribed. You should take it for the entire duration of the prescription. This may cause an upset stomach. This is normal. You may take this with food. You may also eat yogurt to prevent diarrhea. Home care instructions are as follows:  Drink plenty of fluids.  Eat a low residue diet Follow up with: Your primary care provider in 1 week if possible for reevaluation Please seek immediate medical care if you develop any of the following symptoms: Your pain gets worse. Your symptoms do not get better with treatment. Your symptoms suddenly get worse. You have a fever. You vomit more than one time. You have stools that are bloody, black, or tarry. At this time there does not appear to be the presence of an emergent medical condition, however there is always the potential for conditions to change. Please read and follow the below instructions.  Do not take your medicine if  develop an itchy rash, swelling in your mouth or lips, or difficulty breathing; call 911 and seek immediate emergency medical attention if this occurs.  You may review your lab tests and imaging results in their entirety on your MyChart account.  Please discuss all results of fully with your primary care provider and other specialist at your follow-up visit.  Note: Portions of this text may have been transcribed using voice recognition software. Every effort was made to ensure accuracy; however, inadvertent computerized transcription errors may still be present.

## 2022-11-08 NOTE — ED Notes (Signed)
Assumed care of patient. Patient resting in bed with no signs of acute distress noted. Pt waiting on CT scan.

## 2023-08-09 ENCOUNTER — Ambulatory Visit (INDEPENDENT_AMBULATORY_CARE_PROVIDER_SITE_OTHER): Payer: PRIVATE HEALTH INSURANCE

## 2023-08-09 ENCOUNTER — Encounter (HOSPITAL_COMMUNITY): Payer: Self-pay

## 2023-08-09 ENCOUNTER — Telehealth: Payer: Self-pay

## 2023-08-09 ENCOUNTER — Ambulatory Visit
Admission: RE | Admit: 2023-08-09 | Discharge: 2023-08-09 | Disposition: A | Discharge status: Home / Self Care | Payer: BC Managed Care – PPO | Source: Ambulatory Visit | Attending: Family Medicine | Admitting: Family Medicine

## 2023-08-09 VITALS — BP 131/92 | HR 109 | Temp 97.7°F | Resp 17

## 2023-08-09 DIAGNOSIS — R053 Chronic cough: Secondary | ICD-10-CM | POA: Diagnosis not present

## 2023-08-09 DIAGNOSIS — R059 Cough, unspecified: Secondary | ICD-10-CM | POA: Diagnosis not present

## 2023-08-09 MED ORDER — DOXYCYCLINE HYCLATE 100 MG PO CAPS: ORAL_CAPSULE | ORAL | 0 refills | Status: AC

## 2023-08-09 NOTE — Discharge Instructions (Signed)
Take plain guaifenesin (1200mg  extended release tabs such as Mucinex) twice daily, with plenty of water, for cough and congestion.   If final x-ray interpretation shows pneumonia, begin doxycycline for one week.  If symptoms become significantly worse during the night or over the weekend, proceed to the local emergency room.

## 2023-08-09 NOTE — ED Triage Notes (Signed)
Pt c/o non productive cough x 2 weeks. Denies having a cold when it started, denies fever. Feels the urge to cough when he takes deep breath. Hx of pneumonia.

## 2023-08-09 NOTE — Telephone Encounter (Signed)
Called pt to notify of neg cxr results. No need to fill abx script unless fever develops or sxs worsen. Call back if any questions or concerns.

## 2023-08-09 NOTE — ED Provider Notes (Signed)
[facility]    CSN: [id number] Arrival date & time: 08/09/23  0840      History   Chief Complaint Chief Complaint  Patient presents with   Cough    APPT 9AM, x2 wks    HPI Benjamin Hester is a 52 y.o. male.   Patient complains of onset of a non-productive cough two weeks ago without preceding viral URI.  He denies fever or pleuritic pain, and states that he has an urge to cough whenever he takes a deep breath.  He notes that he is still recovering from a right shoulder replacement two months ago. He has a history of pneumonia about 4 years ago following a severe COVID infection.  The history is provided by the patient.    No past medical history on file.  There are no problems to display for this patient.        Home Medications    Prior to Admission medications   Medication Sig Start Date End Date Taking? Authorizing Provider  doxycycline (VIBRAMYCIN) 100 MG capsule Take one cap PO Q12hr with food. 08/09/23  Yes Lattie Haw, MD    Family History No family history on file.  Social History Social History   Tobacco Use   Smoking status: Never   Smokeless tobacco: Never  Vaping Use   Vaping status: Never Used  Substance Use Topics   Alcohol use: Not Currently     Allergies   Patient has no known allergies.   Review of Systems Review of Systems No sore throat + cough, non-productive No pleuritic pain No wheezing No nasal congestion No post-nasal drainage No sinus pain/pressure No itchy/red eyes No earache No hemoptysis No SOB No fever/chills No nausea No vomiting No abdominal pain No diarrhea No urinary symptoms No skin rash No fatigue No myalgias No headache   Physical Exam Triage Vital Signs ED Triage Vitals  Encounter Vitals Group     BP 08/09/23 0900 (!) 131/92     Systolic BP Percentile --      Diastolic BP Percentile --      Pulse Rate 08/09/23 0900 (!) 109     Resp 08/09/23 0900 17     Temp 08/09/23 0900  97.7 F (36.5 C)     Temp Source 08/09/23 0900 Oral     SpO2 08/09/23 0900 96 %     Weight --      Height --      Head Circumference --      Peak Flow --      Pain Score 08/09/23 0901 0     Pain Loc --      Pain Education --      Exclude from Growth Chart --    No data found.  Updated Vital Signs BP (!) 131/92 (BP Location: Left Arm)   Pulse (!) 109   Temp 97.7 F (36.5 C) (Oral)   Resp 17   SpO2 96%   Visual Acuity Right Eye Distance:   Left Eye Distance:   Bilateral Distance:    Right Eye Near:   Left Eye Near:    Bilateral Near:     Physical Exam Nursing notes and Vital Signs reviewed. Appearance:  Patient appears stated age, and in no acute distress Eyes:  Pupils are equal, round, and reactive to light and accomodation.  Extraocular movement is intact.  Conjunctivae are not inflamed  Ears:  Canals normal.  Tympanic membranes normal.  Nose: Normal turbinates.  No sinus  tenderness.  Pharynx:  Normal Neck:  Supple. No adenopathy.  Lungs:  Clear to auscultation.  Breath sounds are equal.  Moving air well. Heart:  Regular rate and rhythm without murmurs, rubs, or gallops.  Abdomen:  Nontender without masses or hepatosplenomegaly.  Bowel sounds are present.  No CVA or flank tenderness.  Extremities:  No edema.  Skin:  No rash present.   UC Treatments / Results  Labs (all labs ordered are listed, but only abnormal results are displayed) Labs Reviewed - No data to display  EKG   Radiology No results found.  Procedures Procedures (including critical care time)  Medications Ordered in UC Medications - No data to display  Initial Impression / Assessment and Plan / UC Course  I have reviewed the triage vital signs and the nursing notes.  Pertinent labs & imaging results that were available during my care of the patient were reviewed by me and considered in my medical decision making (see chart for details).    Normal exam.  Await results of chest  x-ray. Begin doxycycline if pneumonia is present (later:  chest x-ray negative.  Begin doxycycline if not improved about one week; given an Rx to hold).  Final Clinical Impressions(s) / UC Diagnoses   Final diagnoses:  Persistent dry cough     Discharge Instructions      Take plain guaifenesin (1200mg  extended release tabs such as Mucinex) twice daily, with plenty of water, for cough and congestion.   Begin doxycycline if not improved about one week.  If symptoms become significantly worse during the night or over the weekend, proceed to the local emergency room.      ED Prescriptions     Medication Sig Dispense Auth. Provider   doxycycline (VIBRAMYCIN) 100 MG capsule Take one cap PO Q12hr with food. 14 capsule Lattie Haw, MD         Lattie Haw, MD 08/11/23 [phone]
